# Patient Record
Sex: Male | Born: 1995 | Race: White | Hispanic: No | Marital: Single | Smoking: Current every day smoker
Health system: Southern US, Community
[De-identification: ages and names within clinical notes are randomized; demographics above are authoritative.]

---
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
## 2016-03-06 ENCOUNTER — Emergency Department
Admission: EM | Admit: 2016-03-06 | Discharge: 2016-03-06 | Disposition: A | Discharge status: Home / Self Care | Payer: No Typology Code available for payment source | Attending: Emergency Medicine | Admitting: Emergency Medicine

## 2016-03-06 ENCOUNTER — Encounter: Payer: Self-pay | Admitting: Emergency Medicine

## 2016-03-06 DIAGNOSIS — Y92524 Gas station as the place of occurrence of the external cause: Secondary | ICD-10-CM | POA: Insufficient documentation

## 2016-03-06 DIAGNOSIS — Y999 Unspecified external cause status: Secondary | ICD-10-CM | POA: Insufficient documentation

## 2016-03-06 DIAGNOSIS — F17200 Nicotine dependence, unspecified, uncomplicated: Secondary | ICD-10-CM | POA: Diagnosis not present

## 2016-03-06 DIAGNOSIS — W3400XA Accidental discharge from unspecified firearms or gun, initial encounter: Secondary | ICD-10-CM

## 2016-03-06 DIAGNOSIS — S00401A Unspecified superficial injury of right ear, initial encounter: Secondary | ICD-10-CM | POA: Diagnosis not present

## 2016-03-06 DIAGNOSIS — Y939 Activity, unspecified: Secondary | ICD-10-CM | POA: Insufficient documentation

## 2016-03-06 DIAGNOSIS — S0991XA Unspecified injury of ear, initial encounter: Secondary | ICD-10-CM

## 2016-03-06 MED ORDER — HYDROCODONE-ACETAMINOPHEN 5-325 MG PO TABS: 1.0000 | ORAL_TABLET | Freq: Four times a day (QID) | ORAL | 0 refills | Status: DC | PRN

## 2016-03-06 MED ORDER — CEPHALEXIN 500 MG PO CAPS: 500.0000 mg | ORAL_CAPSULE | Freq: Three times a day (TID) | ORAL | 0 refills | Status: DC

## 2016-03-06 MED ORDER — BACITRACIN ZINC 500 UNIT/GM EX OINT
TOPICAL_OINTMENT | Freq: Once | CUTANEOUS | Status: AC
  Administered 2016-03-06: 1 via TOPICAL
  Filled 2016-03-06: qty 0.9

## 2016-03-06 MED ORDER — CEFAZOLIN SODIUM 1 G IJ SOLR
1.0000 g | Freq: Once | INTRAMUSCULAR | Status: AC
  Administered 2016-03-06: 1 g via INTRAMUSCULAR
  Filled 2016-03-06: qty 10

## 2016-03-06 MED ORDER — IBUPROFEN 800 MG PO TABS: 800.0000 mg | ORAL_TABLET | Freq: Three times a day (TID) | ORAL | 0 refills | Status: DC | PRN
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
## 2016-03-06 NOTE — Discharge Instructions (Signed)
1. Keep wound clean and dry. You may apply thinly of Neosporin daily to right ear x 3 days. 2. Apply cool compress to affected area several times daily. 3. You may take pain medicines as needed (Motrin/Norco #15). 4. Return to the ER for worsening symptoms, significant swelling, purulent discharge or other concerns.

## 2016-03-06 NOTE — ED Notes (Signed)
[employer]udloggerol[employer] officer and detective in with patient.

## 2016-03-06 NOTE — ED Notes (Signed)
Detective in with patient and family.
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
## 2016-03-06 NOTE — ED Notes (Signed)
Cleaned pt. Ear front and back.
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
## 2016-03-06 NOTE — ED Notes (Signed)
Pt. Going home with family. 

## 2018-04-08 ENCOUNTER — Other Ambulatory Visit: Payer: Self-pay

## 2018-04-08 ENCOUNTER — Emergency Department
Admission: EM | Admit: 2018-04-08 | Discharge: 2018-04-08 | Disposition: A | Discharge status: Home / Self Care | Payer: 59 | Attending: Emergency Medicine | Admitting: Emergency Medicine

## 2018-04-08 DIAGNOSIS — J029 Acute pharyngitis, unspecified: Secondary | ICD-10-CM | POA: Insufficient documentation

## 2018-04-08 DIAGNOSIS — F17200 Nicotine dependence, unspecified, uncomplicated: Secondary | ICD-10-CM | POA: Diagnosis not present

## 2018-04-08 LAB — GROUP A STREP BY PCR: Group A Strep by PCR: NOT DETECTED

## 2018-04-08 LAB — MONONUCLEOSIS SCREEN: MONO SCREEN: NEGATIVE

## 2018-04-08 MED ORDER — PENICILLIN G BENZATHINE & PROC 1200000 UNIT/2ML IM SUSP: 1.2000 10*6.[IU] | Freq: Once | INTRAMUSCULAR | Status: DC

## 2018-04-08 MED ORDER — AMOXICILLIN-POT CLAVULANATE 875-125 MG PO TABS: 1.0000 | ORAL_TABLET | Freq: Two times a day (BID) | ORAL | 0 refills | Status: AC

## 2018-04-08 MED ORDER — AMOXICILLIN-POT CLAVULANATE 875-125 MG PO TABS
1.0000 | ORAL_TABLET | Freq: Once | ORAL | Status: AC
  Administered 2018-04-08: 1 via ORAL
  Filled 2018-04-08: qty 1

## 2018-04-08 NOTE — ED Triage Notes (Signed)
Patient reports sore throat and holt and cold flashes.

## 2018-04-08 NOTE — ED Provider Notes (Signed)
[facility] Emergency Department Provider Note _______________________   First MD Initiated Contact with Patient 04/08/18 0411     (approximate)  I have reviewed the triage vital signs and the nursing notes.   HISTORY  Chief Complaint Sore Throat   HPI John Roberson is a 22 y.o. male presents to the emergency department with a 2-day history of sore throat and fever.  Patient denies any known sick contact.  Patient denies any cough.   Past medical history None There are no active problems to display for this patient.   Past surgical history None  Prior to Admission medications   Medication Sig Start Date End Date Taking? Authorizing Provider  amoxicillin-clavulanate (AUGMENTIN) 875-125 MG tablet Take 1 tablet by mouth 2 (two) times daily for 10 days. 04/08/18 04/18/18  Darci Current, MD  cephALEXin (KEFLEX) 500 MG capsule Take 1 capsule (500 mg total) by mouth 3 (three) times daily. 03/06/16   Irean Hong, MD  HYDROcodone-acetaminophen (NORCO) 5-325 MG tablet Take 1 tablet by mouth every 6 (six) hours as needed for moderate pain. 03/06/16   Irean Hong, MD  ibuprofen (ADVIL,MOTRIN) 800 MG tablet Take 1 tablet (800 mg total) by mouth every 8 (eight) hours as needed for moderate pain. 03/06/16   Irean Hong, MD    Allergies No known drug allergies No family history on file.  Social History Social History   Tobacco Use  . Smoking status: Current Every Day Smoker  . Smokeless tobacco: Never Used  Substance Use Topics  . Alcohol use: Yes  . Drug use: No    Review of Systems Constitutional: Positive for fever/chills Eyes: No visual changes. ENT: Positive for sore throat. Cardiovascular: Denies chest pain. Respiratory: Denies shortness of breath. Gastrointestinal: No abdominal pain.  No nausea, no vomiting.  No diarrhea.  No constipation. Genitourinary: Negative for dysuria. Musculoskeletal: Negative for neck pain.  Negative for back  pain. Integumentary: Negative for rash. Neurological: Negative for headaches, focal weakness or numbness.   ____________________________________________   PHYSICAL EXAM:  VITAL SIGNS: ED Triage Vitals  Enc Vitals Group     BP 04/08/18 0132 (!) 112/54     Pulse Rate 04/08/18 0132 95     Resp 04/08/18 0132 16     Temp 04/08/18 0132 99.3 F (37.4 C)     Temp Source 04/08/18 0132 Oral     SpO2 04/08/18 0132 98 %     Weight 04/08/18 0131 72.6 kg (160 lb)     Height 04/08/18 0131 1.854 m (6\' 1" )     Head Circumference --      Peak Flow --      Pain Score 04/08/18 0131 6     Pain Loc --      Pain Edu? --      Excl. in GC? --     Constitutional: Alert and oriented. Well appearing and in no acute distress. Eyes: Conjunctivae are normal.  Head: Atraumatic. Nose: No congestion/rhinnorhea. Mouth/Throat: Mucous membranes are moist.  Pharyngeal erythema with exudate on the right tonsil.  No uvula deviation no evidence of peritonsillar abscess Neck: No stridor.  Palpable anterior cervical lymphadenopathy Cardiovascular: Normal rate, regular rhythm. Good peripheral circulation. Grossly normal heart sounds. Respiratory: Normal respiratory effort.  No retractions. Lungs CTAB. Neurologic:  Normal speech and language. No gross focal neurologic deficits are appreciated.  Skin:  Skin is warm, dry and intact. No rash noted.   ____________________________________________   LABS (all labs  ordered are listed, but only abnormal results are displayed)  Labs Reviewed  GROUP A STREP BY PCR  MONONUCLEOSIS SCREEN        Procedures   ____________________________________________   INITIAL IMPRESSION / ASSESSMENT AND PLAN / ED COURSE  As part of my medical decision making, I reviewed the following data within the electronic MEDICAL RECORD NUMBER   22 year old male presenting with above-stated history and physical exam consistent with pharyngitis.  Consider the possibility of strep  pharyngitis however rapid strep negative.  Also considered possibility of mononucleosis which was negative. ____________________________________________  FINAL CLINICAL IMPRESSION(S) / ED DIAGNOSES  Final diagnoses:  Acute pharyngitis, unspecified etiology     MEDICATIONS GIVEN DURING THIS VISIT:  Medications  amoxicillin-clavulanate (AUGMENTIN) 875-125 MG per tablet 1 tablet (1 tablet Oral Given 04/08/18 0531)     ED Discharge Orders         Ordered    amoxicillin-clavulanate (AUGMENTIN) 875-125 MG tablet  2 times daily     04/08/18 0516           Note:  This document was prepared using Dragon voice recognition software and may include unintentional dictation errors.    Darci Current, MD 04/08/18 [phone]

## 2021-04-08 ENCOUNTER — Ambulatory Visit: Payer: Self-pay

## 2021-05-17 ENCOUNTER — Ambulatory Visit: Payer: Self-pay | Admitting: Physician Assistant

## 2021-05-17 ENCOUNTER — Other Ambulatory Visit: Payer: Self-pay

## 2021-05-17 ENCOUNTER — Encounter: Payer: Self-pay | Admitting: Physician Assistant

## 2021-05-17 DIAGNOSIS — Z202 Contact with and (suspected) exposure to infections with a predominantly sexual mode of transmission: Secondary | ICD-10-CM

## 2021-05-17 DIAGNOSIS — Z113 Encounter for screening for infections with a predominantly sexual mode of transmission: Secondary | ICD-10-CM

## 2021-05-17 MED ORDER — DOXYCYCLINE HYCLATE 100 MG PO TABS: 100.0000 mg | ORAL_TABLET | Freq: Two times a day (BID) | ORAL | 0 refills | Status: AC

## 2021-05-17 MED ORDER — CEFTRIAXONE SODIUM 500 MG IJ SOLR
500.0000 mg | Freq: Once | INTRAMUSCULAR | Status: AC
  Administered 2021-05-17: 500 mg via INTRAMUSCULAR

## 2021-05-17 NOTE — Progress Notes (Signed)
Pt here as a contact to Gonorrhea and Chlamydia.  Ceftriaxone 500 mg given IM without any complications.  Medication dispensed per Provider orders. Berdie Ogren, RN

## 2021-05-17 NOTE — Progress Notes (Signed)
   [facility] Department STI clinic/screening visit  Subjective:  John Roberson is a 25 y.o. male being seen today for an STI screening visit. The patient reports they do have symptoms.    Patient has the following medical conditions:  There are no problems to display for this patient.    Chief Complaint  Patient presents with   SEXUALLY TRANSMITTED DISEASE    screening    HPI  Patient reports that he has had penile discharge and dysuria for about 1 week and he is a contact to GC and Chlamydia.  Denies chronic conditions, surgeries and regular medicines.  Reports last HIV test was 2 weeks ago  and last void prior to sample collection for Gram stain was 1 hr ago.    Screening for MPX risk: Does the patient have an unexplained rash? No Is the patient MSM? No Does the patient endorse multiple sex partners or anonymous sex partners? No Did the patient have close or sexual contact with a person diagnosed with MPX? No Has the patient traveled outside the [place] where MPX is endemic? No Is there a high clinical suspicion for MPX-- evidenced by one of the following No  -Unlikely to be chickenpox  -Lymphadenopathy  -Rash that present in same phase of evolution on any given body part   See flowsheet for further details and programmatic requirements.    The following portions of the patient's history were reviewed and updated as appropriate: allergies, current medications, past medical history, past social history, past surgical history and problem list.  Objective:  There were no vitals filed for this visit.  Physical Exam Constitutional:      General: He is not in acute distress.    Appearance: Normal appearance.  HENT:     Head: Normocephalic and atraumatic.  Eyes:     Conjunctiva/sclera: Conjunctivae normal.  Pulmonary:     Effort: Pulmonary effort is normal.  Skin:    General: Skin is warm and dry.  Neurological:     Mental Status: He is alert and oriented to  person, place, and time.  Psychiatric:        Mood and Affect: Mood normal.        Behavior: Behavior normal.        Thought Content: Thought content normal.        Judgment: Judgment normal.      Assessment and Plan:  John Roberson is a 25 y.o. male presenting to the [facility] Department for STI screening  1. Screening for STD (sexually transmitted disease) Patient into clinic with symptoms. Patient declines blood work and screening exam.  Requests treatment only today.  Rec condoms with all sex.  2. Venereal disease contact Treat as a contact to GC and Chlamydia with Ceftriaxone 500 mg IM and  Doxycycline 100 mg #14 1 po BID for 7 days. No sex for 14 days and until after partner completes treatment. Call with questions or concerns.  - cefTRIAXone (ROCEPHIN) injection 500 mg - doxycycline (VIBRA-TABS) 100 MG tablet; Take 1 tablet (100 mg total) by mouth 2 (two) times daily for 7 days.  Dispense: 14 tablet; Refill: 0     No follow-ups on file.  No future appointments.  Matt Holmes, PA

## 2022-10-11 ENCOUNTER — Inpatient Hospital Stay (HOSPITAL_COMMUNITY): Payer: Medicaid Other

## 2022-10-11 ENCOUNTER — Emergency Department (HOSPITAL_COMMUNITY): Payer: Medicaid Other

## 2022-10-11 ENCOUNTER — Inpatient Hospital Stay (HOSPITAL_COMMUNITY): Payer: Medicaid Other | Admitting: Anesthesiology

## 2022-10-11 ENCOUNTER — Inpatient Hospital Stay (HOSPITAL_COMMUNITY)
Admission: EM | Admit: 2022-10-11 | Discharge: 2022-10-12 | DRG: 482 | Disposition: A | Discharge status: Home / Self Care | Payer: Medicaid Other | Attending: Surgery | Admitting: Surgery

## 2022-10-11 ENCOUNTER — Encounter (HOSPITAL_COMMUNITY): Payer: Self-pay

## 2022-10-11 ENCOUNTER — Encounter (HOSPITAL_COMMUNITY): Admission: EM | Disposition: A | Discharge status: Home / Self Care | Payer: Self-pay | Source: Home / Self Care

## 2022-10-11 DIAGNOSIS — S72302A Unspecified fracture of shaft of left femur, initial encounter for closed fracture: Secondary | ICD-10-CM | POA: Diagnosis present

## 2022-10-11 DIAGNOSIS — E559 Vitamin D deficiency, unspecified: Secondary | ICD-10-CM | POA: Diagnosis present

## 2022-10-11 DIAGNOSIS — S7290XA Unspecified fracture of unspecified femur, initial encounter for closed fracture: Secondary | ICD-10-CM | POA: Diagnosis present

## 2022-10-11 DIAGNOSIS — Y92410 Unspecified street and highway as the place of occurrence of the external cause: Secondary | ICD-10-CM | POA: Diagnosis not present

## 2022-10-11 DIAGNOSIS — F10120 Alcohol abuse with intoxication, uncomplicated: Secondary | ICD-10-CM | POA: Diagnosis present

## 2022-10-11 DIAGNOSIS — S728X2A Other fracture of left femur, initial encounter for closed fracture: Secondary | ICD-10-CM

## 2022-10-11 DIAGNOSIS — Y908 Blood alcohol level of 240 mg/100 ml or more: Secondary | ICD-10-CM | POA: Diagnosis present

## 2022-10-11 DIAGNOSIS — F17200 Nicotine dependence, unspecified, uncomplicated: Secondary | ICD-10-CM | POA: Diagnosis present

## 2022-10-11 DIAGNOSIS — F10920 Alcohol use, unspecified with intoxication, uncomplicated: Secondary | ICD-10-CM

## 2022-10-11 DIAGNOSIS — S72322A Displaced transverse fracture of shaft of left femur, initial encounter for closed fracture: Principal | ICD-10-CM | POA: Diagnosis present

## 2022-10-11 HISTORY — PX: FEMUR IM NAIL: SHX1597

## 2022-10-11 LAB — I-STAT CHEM 8, ED
BUN: 5 mg/dL — ABNORMAL LOW (ref 6–20)
Calcium, Ion: 0.96 mmol/L — ABNORMAL LOW (ref 1.15–1.40)
Chloride: 106 mmol/L (ref 98–111)
Creatinine, Ser: 1.1 mg/dL (ref 0.61–1.24)
Glucose, Bld: 89 mg/dL (ref 70–99)
HCT: 39 % (ref 39.0–52.0)
Hemoglobin: 13.3 g/dL (ref 13.0–17.0)
Potassium: 3.4 mmol/L — ABNORMAL LOW (ref 3.5–5.1)
Sodium: 141 mmol/L (ref 135–145)
TCO2: 21 mmol/L — ABNORMAL LOW (ref 22–32)

## 2022-10-11 LAB — URINALYSIS, ROUTINE W REFLEX MICROSCOPIC
Bilirubin Urine: NEGATIVE
Glucose, UA: NEGATIVE mg/dL
Hgb urine dipstick: NEGATIVE
Ketones, ur: NEGATIVE mg/dL
Leukocytes,Ua: NEGATIVE
Nitrite: NEGATIVE
Protein, ur: NEGATIVE mg/dL
Specific Gravity, Urine: 1.01 (ref 1.005–1.030)
pH: 6 (ref 5.0–8.0)

## 2022-10-11 LAB — CBC
HCT: 39.7 % (ref 39.0–52.0)
HCT: 36.6 % — ABNORMAL LOW (ref 39.0–52.0)
Hemoglobin: 13.3 g/dL (ref 13.0–17.0)
Hemoglobin: 12.4 g/dL — ABNORMAL LOW (ref 13.0–17.0)
MCH: 30.9 pg (ref 26.0–34.0)
MCH: 30.5 pg (ref 26.0–34.0)
MCHC: 33.5 g/dL (ref 30.0–36.0)
MCHC: 33.9 g/dL (ref 30.0–36.0)
MCV: 92.1 fL (ref 80.0–100.0)
MCV: 90.1 fL (ref 80.0–100.0)
Platelets: 312 10*3/uL (ref 150–400)
Platelets: 300 10*3/uL (ref 150–400)
RBC: 4.31 MIL/uL (ref 4.22–5.81)
RBC: 4.06 MIL/uL — ABNORMAL LOW (ref 4.22–5.81)
RDW: 13.6 % (ref 11.5–15.5)
RDW: 13.5 % (ref 11.5–15.5)
WBC: 10.2 10*3/uL (ref 4.0–10.5)
WBC: 17.5 10*3/uL — ABNORMAL HIGH (ref 4.0–10.5)
nRBC: 0 % (ref 0.0–0.2)
nRBC: 0 % (ref 0.0–0.2)

## 2022-10-11 LAB — COMPREHENSIVE METABOLIC PANEL
ALT: 14 U/L (ref 0–44)
AST: 18 U/L (ref 15–41)
Albumin: 3.6 g/dL (ref 3.5–5.0)
Alkaline Phosphatase: 51 U/L (ref 38–126)
Anion gap: 13 (ref 5–15)
BUN: 5 mg/dL — ABNORMAL LOW (ref 6–20)
CO2: 19 mmol/L — ABNORMAL LOW (ref 22–32)
Calcium: 7.5 mg/dL — ABNORMAL LOW (ref 8.9–10.3)
Chloride: 107 mmol/L (ref 98–111)
Creatinine, Ser: 0.88 mg/dL (ref 0.61–1.24)
GFR, Estimated: >60 mL/min (ref >60)
Glucose, Bld: 92 mg/dL (ref 70–99)
Potassium: 3.4 mmol/L — ABNORMAL LOW (ref 3.5–5.1)
Sodium: 139 mmol/L (ref 135–145)
Total Bilirubin: 0.4 mg/dL (ref 0.3–1.2)
Total Protein: 6 g/dL — ABNORMAL LOW (ref 6.5–8.1)

## 2022-10-11 LAB — PROTIME-INR
INR: 1.1 (ref 0.8–1.2)
Prothrombin Time: 13.9 seconds (ref 11.4–15.2)

## 2022-10-11 LAB — LACTIC ACID, PLASMA: Lactic Acid, Venous: 2.4 mmol/L (ref 0.5–1.9)

## 2022-10-11 LAB — SAMPLE TO BLOOD BANK

## 2022-10-11 LAB — CREATININE, SERUM
Creatinine, Ser: 0.81 mg/dL (ref 0.61–1.24)
GFR, Estimated: >60 mL/min (ref >60)

## 2022-10-11 LAB — SURGICAL PCR SCREEN
MRSA, PCR: NEGATIVE
Staphylococcus aureus: NEGATIVE

## 2022-10-11 LAB — ETHANOL: Alcohol, Ethyl (B): 266 mg/dL — ABNORMAL HIGH (ref <10)

## 2022-10-11 LAB — HIV ANTIBODY (ROUTINE TESTING W REFLEX): HIV Screen 4th Generation wRfx: NONREACTIVE

## 2022-10-11 SURGERY — INSERTION, INTRAMEDULLARY ROD, FEMUR
Anesthesia: General | Site: Leg Upper | Laterality: Left

## 2022-10-11 MED ORDER — LORAZEPAM 1 MG PO TABS: 1.0000 mg | ORAL_TABLET | ORAL | Status: DC | PRN

## 2022-10-11 MED ORDER — CHLORHEXIDINE GLUCONATE 4 % EX LIQD: 60.0000 mL | Freq: Once | CUTANEOUS | Status: DC

## 2022-10-11 MED ORDER — ONDANSETRON HCL 4 MG PO TABS: 4.0000 mg | ORAL_TABLET | Freq: Four times a day (QID) | ORAL | Status: DC | PRN

## 2022-10-11 MED ORDER — DEXTROSE-NACL 5-0.45 % IV SOLN: INTRAVENOUS | Status: DC

## 2022-10-11 MED ORDER — FENTANYL CITRATE (PF) 100 MCG/2ML IJ SOLN
INTRAMUSCULAR | Status: AC
  Filled 2022-10-11: qty 2

## 2022-10-11 MED ORDER — SUGAMMADEX SODIUM 200 MG/2ML IV SOLN
INTRAVENOUS | Status: DC | PRN
  Administered 2022-10-11: 200 mg via INTRAVENOUS

## 2022-10-11 MED ORDER — ONDANSETRON HCL 4 MG/2ML IJ SOLN: 4.0000 mg | Freq: Four times a day (QID) | INTRAMUSCULAR | Status: DC | PRN

## 2022-10-11 MED ORDER — MIDAZOLAM HCL 5 MG/5ML IJ SOLN
INTRAMUSCULAR | Status: DC | PRN
  Administered 2022-10-11: 2 mg via INTRAVENOUS

## 2022-10-11 MED ORDER — LIDOCAINE 2% (20 MG/ML) 5 ML SYRINGE
INTRAMUSCULAR | Status: DC | PRN
  Administered 2022-10-11: 100 mg via INTRAVENOUS

## 2022-10-11 MED ORDER — DEXAMETHASONE SODIUM PHOSPHATE 10 MG/ML IJ SOLN
INTRAMUSCULAR | Status: AC
  Filled 2022-10-11: qty 1

## 2022-10-11 MED ORDER — ONDANSETRON HCL 4 MG/2ML IJ SOLN
INTRAMUSCULAR | Status: DC | PRN
  Administered 2022-10-11: 4 mg via INTRAVENOUS

## 2022-10-11 MED ORDER — PHENOL 1.4 % MT LIQD: 1.0000 | OROMUCOSAL | Status: DC | PRN

## 2022-10-11 MED ORDER — MORPHINE SULFATE (PF) 2 MG/ML IV SOLN
2.0000 mg | INTRAVENOUS | Status: DC | PRN
  Administered 2022-10-11: 4 mg via INTRAVENOUS
  Administered 2022-10-11 (×2): 2 mg via INTRAVENOUS
  Filled 2022-10-11: qty 2
  Filled 2022-10-11: qty 1
  Filled 2022-10-11: qty 2
  Filled 2022-10-11: qty 1

## 2022-10-11 MED ORDER — CEFAZOLIN SODIUM-DEXTROSE 2-4 GM/100ML-% IV SOLN
2.0000 g | INTRAVENOUS | Status: AC
  Administered 2022-10-11: 2 g via INTRAVENOUS
  Filled 2022-10-11: qty 100

## 2022-10-11 MED ORDER — CEFAZOLIN SODIUM-DEXTROSE 2-4 GM/100ML-% IV SOLN
2.0000 g | Freq: Four times a day (QID) | INTRAVENOUS | Status: AC
  Administered 2022-10-11 – 2022-10-12 (×2): 2 g via INTRAVENOUS
  Filled 2022-10-11 (×2): qty 100

## 2022-10-11 MED ORDER — MIDAZOLAM HCL 2 MG/2ML IJ SOLN
INTRAMUSCULAR | Status: AC
  Filled 2022-10-11: qty 2

## 2022-10-11 MED ORDER — HYDROMORPHONE HCL 1 MG/ML IJ SOLN
0.5000 mg | INTRAMUSCULAR | Status: DC | PRN
  Administered 2022-10-12: 1 mg via INTRAVENOUS
  Filled 2022-10-11 (×2): qty 1

## 2022-10-11 MED ORDER — ACETAMINOPHEN 325 MG PO TABS: 325.0000 mg | ORAL_TABLET | Freq: Four times a day (QID) | ORAL | Status: DC | PRN

## 2022-10-11 MED ORDER — ACETAMINOPHEN 325 MG PO TABS
650.0000 mg | ORAL_TABLET | Freq: Four times a day (QID) | ORAL | Status: DC
  Administered 2022-10-11 – 2022-10-12 (×3): 650 mg via ORAL
  Filled 2022-10-11 (×3): qty 2

## 2022-10-11 MED ORDER — ONDANSETRON HCL 4 MG/2ML IJ SOLN
4.0000 mg | Freq: Once | INTRAMUSCULAR | Status: AC
  Administered 2022-10-11: 4 mg via INTRAVENOUS
  Filled 2022-10-11: qty 2

## 2022-10-11 MED ORDER — CEFAZOLIN SODIUM-DEXTROSE 2-4 GM/100ML-% IV SOLN
2.0000 g | Freq: Once | INTRAVENOUS | Status: AC
  Administered 2022-10-11: 2 g via INTRAVENOUS
  Filled 2022-10-11: qty 100

## 2022-10-11 MED ORDER — ADULT MULTIVITAMIN W/MINERALS CH
1.0000 | ORAL_TABLET | Freq: Every day | ORAL | Status: DC
  Administered 2022-10-12: 1 via ORAL
  Filled 2022-10-11: qty 1

## 2022-10-11 MED ORDER — 0.9 % SODIUM CHLORIDE (POUR BTL) OPTIME
TOPICAL | Status: DC | PRN
  Administered 2022-10-11: 1000 mL

## 2022-10-11 MED ORDER — ORAL CARE MOUTH RINSE: 15.0000 mL | Freq: Once | OROMUCOSAL | Status: AC

## 2022-10-11 MED ORDER — POVIDONE-IODINE 10 % EX SWAB
2.0000 | Freq: Once | CUTANEOUS | Status: AC
  Administered 2022-10-11: 2 via TOPICAL

## 2022-10-11 MED ORDER — LACTATED RINGERS IV SOLN: INTRAVENOUS | Status: DC

## 2022-10-11 MED ORDER — LORAZEPAM 2 MG/ML IJ SOLN: 1.0000 mg | INTRAMUSCULAR | Status: DC | PRN

## 2022-10-11 MED ORDER — OXYCODONE HCL 5 MG PO TABS
10.0000 mg | ORAL_TABLET | ORAL | Status: DC | PRN
  Administered 2022-10-11: 10 mg via ORAL
  Administered 2022-10-12 (×3): 15 mg via ORAL
  Filled 2022-10-11 (×2): qty 2
  Filled 2022-10-11 (×2): qty 3

## 2022-10-11 MED ORDER — DOCUSATE SODIUM 100 MG PO CAPS: 100.0000 mg | ORAL_CAPSULE | Freq: Two times a day (BID) | ORAL | Status: DC

## 2022-10-11 MED ORDER — ACETAMINOPHEN 500 MG PO TABS
1000.0000 mg | ORAL_TABLET | Freq: Once | ORAL | Status: AC
  Administered 2022-10-11: 1000 mg via ORAL
  Filled 2022-10-11: qty 2

## 2022-10-11 MED ORDER — FENTANYL CITRATE PF 50 MCG/ML IJ SOSY
50.0000 ug | PREFILLED_SYRINGE | Freq: Once | INTRAMUSCULAR | Status: AC
  Administered 2022-10-11: 50 ug via INTRAVENOUS
  Filled 2022-10-11: qty 1

## 2022-10-11 MED ORDER — FENTANYL CITRATE (PF) 100 MCG/2ML IJ SOLN
25.0000 ug | INTRAMUSCULAR | Status: DC | PRN
  Administered 2022-10-11: 50 ug via INTRAVENOUS

## 2022-10-11 MED ORDER — LEVETIRACETAM IN NACL 500 MG/100ML IV SOLN
500.0000 mg | Freq: Two times a day (BID) | INTRAVENOUS | Status: DC
  Administered 2022-10-11 – 2022-10-12 (×3): 500 mg via INTRAVENOUS
  Filled 2022-10-11 (×3): qty 100

## 2022-10-11 MED ORDER — PROMETHAZINE HCL 25 MG/ML IJ SOLN: 6.2500 mg | INTRAMUSCULAR | Status: DC | PRN

## 2022-10-11 MED ORDER — METHOCARBAMOL 500 MG PO TABS
1000.0000 mg | ORAL_TABLET | Freq: Three times a day (TID) | ORAL | Status: DC
  Administered 2022-10-11 – 2022-10-12 (×3): 1000 mg via ORAL
  Filled 2022-10-11 (×3): qty 2

## 2022-10-11 MED ORDER — OXYCODONE HCL 5 MG PO TABS: 5.0000 mg | ORAL_TABLET | ORAL | Status: DC | PRN

## 2022-10-11 MED ORDER — AMISULPRIDE (ANTIEMETIC) 5 MG/2ML IV SOLN: 10.0000 mg | Freq: Once | INTRAVENOUS | Status: DC | PRN

## 2022-10-11 MED ORDER — SODIUM CHLORIDE 0.9 % IV SOLN: INTRAVENOUS | Status: DC

## 2022-10-11 MED ORDER — FOLIC ACID 1 MG PO TABS
1.0000 mg | ORAL_TABLET | Freq: Every day | ORAL | Status: DC
  Administered 2022-10-12: 1 mg via ORAL
  Filled 2022-10-11: qty 1

## 2022-10-11 MED ORDER — MENTHOL 3 MG MT LOZG: 1.0000 | LOZENGE | OROMUCOSAL | Status: DC | PRN

## 2022-10-11 MED ORDER — THIAMINE MONONITRATE 100 MG PO TABS
100.0000 mg | ORAL_TABLET | Freq: Every day | ORAL | Status: DC
  Administered 2022-10-12: 100 mg via ORAL
  Filled 2022-10-11: qty 1

## 2022-10-11 MED ORDER — CHLORHEXIDINE GLUCONATE 0.12 % MT SOLN: 15.0000 mL | Freq: Once | OROMUCOSAL | Status: AC

## 2022-10-11 MED ORDER — FENTANYL CITRATE (PF) 250 MCG/5ML IJ SOLN
INTRAMUSCULAR | Status: AC
  Filled 2022-10-11: qty 5

## 2022-10-11 MED ORDER — BISACODYL 5 MG PO TBEC: 5.0000 mg | DELAYED_RELEASE_TABLET | Freq: Every day | ORAL | Status: DC | PRN

## 2022-10-11 MED ORDER — DEXMEDETOMIDINE HCL IN NACL 80 MCG/20ML IV SOLN
INTRAVENOUS | Status: AC
  Filled 2022-10-11: qty 20

## 2022-10-11 MED ORDER — ONDANSETRON 4 MG PO TBDP: 4.0000 mg | ORAL_TABLET | Freq: Four times a day (QID) | ORAL | Status: DC | PRN

## 2022-10-11 MED ORDER — FENTANYL CITRATE (PF) 100 MCG/2ML IJ SOLN
50.0000 ug | Freq: Once | INTRAMUSCULAR | Status: AC
  Administered 2022-10-11: 50 ug via INTRAVENOUS

## 2022-10-11 MED ORDER — METHOCARBAMOL 1000 MG/10ML IJ SOLN
1000.0000 mg | Freq: Three times a day (TID) | INTRAVENOUS | Status: DC
  Filled 2022-10-11: qty 10

## 2022-10-11 MED ORDER — ROCURONIUM BROMIDE 100 MG/10ML IV SOLN
INTRAVENOUS | Status: DC | PRN
  Administered 2022-10-11: 30 mg via INTRAVENOUS
  Administered 2022-10-11: 70 mg via INTRAVENOUS

## 2022-10-11 MED ORDER — PROPOFOL 10 MG/ML IV BOLUS
INTRAVENOUS | Status: DC | PRN
  Administered 2022-10-11: 150 mg via INTRAVENOUS

## 2022-10-11 MED ORDER — IOHEXOL 350 MG/ML SOLN
100.0000 mL | Freq: Once | INTRAVENOUS | Status: AC | PRN
  Administered 2022-10-11: 100 mL via INTRAVENOUS

## 2022-10-11 MED ORDER — ALBUMIN HUMAN 5 % IV SOLN: INTRAVENOUS | Status: DC | PRN

## 2022-10-11 MED ORDER — DEXMEDETOMIDINE HCL IN NACL 80 MCG/20ML IV SOLN
INTRAVENOUS | Status: DC | PRN
  Administered 2022-10-11: 8 ug via BUCCAL

## 2022-10-11 MED ORDER — CHLORHEXIDINE GLUCONATE 0.12 % MT SOLN
OROMUCOSAL | Status: AC
  Administered 2022-10-11: 15 mL via OROMUCOSAL
  Filled 2022-10-11: qty 15

## 2022-10-11 MED ORDER — ACETAMINOPHEN 325 MG PO TABS: 650.0000 mg | ORAL_TABLET | ORAL | Status: DC | PRN

## 2022-10-11 MED ORDER — DOCUSATE SODIUM 100 MG PO CAPS
100.0000 mg | ORAL_CAPSULE | Freq: Two times a day (BID) | ORAL | Status: DC
  Administered 2022-10-11 – 2022-10-12 (×2): 100 mg via ORAL
  Filled 2022-10-11 (×2): qty 1

## 2022-10-11 MED ORDER — METOCLOPRAMIDE HCL 5 MG/ML IJ SOLN: 5.0000 mg | Freq: Three times a day (TID) | INTRAMUSCULAR | Status: DC | PRN

## 2022-10-11 MED ORDER — DEXAMETHASONE SODIUM PHOSPHATE 10 MG/ML IJ SOLN
INTRAMUSCULAR | Status: DC | PRN
  Administered 2022-10-11: 10 mg via INTRAVENOUS

## 2022-10-11 MED ORDER — ENOXAPARIN SODIUM 30 MG/0.3ML IJ SOSY
30.0000 mg | PREFILLED_SYRINGE | Freq: Two times a day (BID) | INTRAMUSCULAR | Status: DC
  Administered 2022-10-12: 30 mg via SUBCUTANEOUS
  Filled 2022-10-11: qty 0.3

## 2022-10-11 MED ORDER — SENNOSIDES-DOCUSATE SODIUM 8.6-50 MG PO TABS: 1.0000 | ORAL_TABLET | Freq: Every evening | ORAL | Status: DC | PRN

## 2022-10-11 MED ORDER — METOCLOPRAMIDE HCL 5 MG PO TABS: 5.0000 mg | ORAL_TABLET | Freq: Three times a day (TID) | ORAL | Status: DC | PRN

## 2022-10-11 MED ORDER — FENTANYL CITRATE (PF) 100 MCG/2ML IJ SOLN
INTRAMUSCULAR | Status: DC | PRN
  Administered 2022-10-11: 100 ug via INTRAVENOUS
  Administered 2022-10-11 (×3): 50 ug via INTRAVENOUS

## 2022-10-11 MED ORDER — THIAMINE HCL 100 MG/ML IJ SOLN: 100.0000 mg | Freq: Every day | INTRAMUSCULAR | Status: DC

## 2022-10-11 MED ORDER — PROPOFOL 10 MG/ML IV BOLUS
INTRAVENOUS | Status: AC
  Filled 2022-10-11: qty 20

## 2022-10-11 SURGICAL SUPPLY — 56 items
BAG COUNTER SPONGE SURGICOUNT (BAG) ×1 IMPLANT
BIT DRILL CANN FLEX 14 (BIT) IMPLANT
BIT DRILL LONG 4.2 (BIT) IMPLANT
BIT DRILL SHORT 4.2 (BIT) IMPLANT
BIT DRILL STEP 4.5X6.5 (BIT) IMPLANT
BNDG COHESIVE 6X5 TAN ST LF (GAUZE/BANDAGES/DRESSINGS) IMPLANT
BRUSH SCRUB EZ PLAIN DRY (MISCELLANEOUS) ×2 IMPLANT
COVER PERINEAL POST (MISCELLANEOUS) ×1 IMPLANT
COVER SURGICAL LIGHT HANDLE (MISCELLANEOUS) ×2 IMPLANT
DRAPE C-ARMOR (DRAPES) ×1 IMPLANT
DRAPE HALF SHEET 40X57 (DRAPES) IMPLANT
DRAPE INCISE IOBAN 66X45 STRL (DRAPES) IMPLANT
DRAPE ORTHO SPLIT 77X108 STRL (DRAPES) ×2
DRAPE SURG ORHT 6 SPLT 77X108 (DRAPES) ×2 IMPLANT
DRAPE U-SHAPE 47X51 STRL (DRAPES) ×1 IMPLANT
DRESSING MEPILEX FLEX 4X4 (GAUZE/BANDAGES/DRESSINGS) ×1 IMPLANT
DRILL BIT SHORT 4.2 (BIT) ×2
DRILL BIT STEP 4.5X6.5 (BIT) ×1
DRSG MEPILEX FLEX 4X4 (GAUZE/BANDAGES/DRESSINGS) ×3
DRSG MEPILEX POST OP 4X8 (GAUZE/BANDAGES/DRESSINGS) ×1 IMPLANT
ELECT REM PT RETURN 9FT ADLT (ELECTROSURGICAL) ×1
ELECTRODE REM PT RTRN 9FT ADLT (ELECTROSURGICAL) ×1 IMPLANT
GLOVE BIO SURGEON STRL SZ7.5 (GLOVE) ×1 IMPLANT
GLOVE BIO SURGEON STRL SZ8 (GLOVE) ×1 IMPLANT
GLOVE BIOGEL PI IND STRL 7.5 (GLOVE) ×1 IMPLANT
GLOVE BIOGEL PI IND STRL 8 (GLOVE) ×1 IMPLANT
GLOVE SURG ORTHO LTX SZ7.5 (GLOVE) ×2 IMPLANT
GOWN STRL REUS W/ TWL LRG LVL3 (GOWN DISPOSABLE) ×2 IMPLANT
GOWN STRL REUS W/ TWL XL LVL3 (GOWN DISPOSABLE) ×1 IMPLANT
GOWN STRL REUS W/TWL LRG LVL3 (GOWN DISPOSABLE) ×2
GOWN STRL REUS W/TWL XL LVL3 (GOWN DISPOSABLE) ×1
GUIDEWIRE 3.2X400 (WIRE) IMPLANT
KIT BASIN OR (CUSTOM PROCEDURE TRAY) ×1 IMPLANT
KIT TURNOVER KIT B (KITS) ×1 IMPLANT
MANIFOLD NEPTUNE II (INSTRUMENTS) ×1 IMPLANT
NAIL CANN FRN LFT 10X400 (Nail) IMPLANT
NS IRRIG 1000ML POUR BTL (IV SOLUTION) ×1 IMPLANT
PACK GENERAL/GYN (CUSTOM PROCEDURE TRAY) ×1 IMPLANT
PAD ARMBOARD 7.5X6 YLW CONV (MISCELLANEOUS) ×2 IMPLANT
REAMER ROD DEEP FLUTE 2.5X950 (INSTRUMENTS) IMPLANT
SCREW HP F/IM NL 6.5/L100/XL40 (Screw) IMPLANT
SCREW LOCK IM 5X38X125 (Screw) IMPLANT
SCREW LOCK IM 5X44 (Screw) IMPLANT
SCREW LOCK IM TI 5X40 STRL (Screw) IMPLANT
STAPLER VISISTAT 35W (STAPLE) ×1 IMPLANT
STOCKINETTE IMPERVIOUS LG (DRAPES) IMPLANT
SUT ETHILON 2 0 FS 18 (SUTURE) ×1 IMPLANT
SUT VIC AB 0 CT1 27 (SUTURE) ×1
SUT VIC AB 0 CT1 27XBRD ANBCTR (SUTURE) ×1 IMPLANT
SUT VIC AB 1 CT1 27 (SUTURE) ×1
SUT VIC AB 1 CT1 27XBRD ANBCTR (SUTURE) ×1 IMPLANT
SUT VIC AB 2-0 CT1 27 (SUTURE) ×1
SUT VIC AB 2-0 CT1 TAPERPNT 27 (SUTURE) ×1 IMPLANT
TOWEL GREEN STERILE (TOWEL DISPOSABLE) ×2 IMPLANT
TOWEL GREEN STERILE FF (TOWEL DISPOSABLE) ×1 IMPLANT
WATER STERILE IRR 1000ML POUR (IV SOLUTION) ×1 IMPLANT

## 2022-10-11 NOTE — ED Notes (Signed)
Pt log rolled. MD noted left flank abrasion.
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
## 2022-10-11 NOTE — ED Notes (Signed)
Pt transported to CT on monitor with RNs.
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
## 2022-10-11 NOTE — Anesthesia Procedure Notes (Signed)
Procedure Name: Intubation Date/Time: 10/11/2022 11:17 AM  Performed by: Mariea Clonts, CRNAPre-anesthesia Checklist: Patient identified, Emergency Drugs available, Suction available and Patient being monitored Patient Re-evaluated:Patient Re-evaluated prior to induction Oxygen Delivery Method: Circle System Utilized Preoxygenation: Pre-oxygenation with 100% oxygen Induction Type: IV induction Ventilation: Mask ventilation without difficulty Laryngoscope Size: Mac and 4 Grade View: Grade I Tube type: Oral Tube size: 7.5 mm Number of attempts: 1 Airway Equipment and Method: Stylet and Oral airway Placement Confirmation: ETT inserted through vocal cords under direct vision, positive ETCO2 and breath sounds checked- equal and bilateral Tube secured with: Tape Dental Injury: Teeth and Oropharynx as per pre-operative assessment

## 2022-10-11 NOTE — Progress Notes (Signed)
PT Cancellation Note  Patient Details Name: John Roberson MRN: [id number] DOB: Apr 01, 1996   Cancelled Treatment:    Reason Eval/Treat Not Completed: Patient not medically ready  OR today;  Will follow up postop;   Roney Marion, PT  Acute Rehabilitation Services Office [phone]   Colletta [place] 10/11/2022, 10:56 AM

## 2022-10-11 NOTE — ED Notes (Signed)
Date and time results received: 10/11/22 0155 (use smartphrase ".now" to insert current time)  Test: Lactic Acid Critical Value: 2.4   Name of Provider Notified: Randal Buba, MD  Orders Received? Or Actions Taken?:  notified

## 2022-10-11 NOTE — Progress Notes (Signed)
Orthopedic Tech Progress Note Patient Details:  John Roberson 06/29/1996 [id number]  Musculoskeletal Traction Type of Traction: Bucks Skin Traction Traction Location: lle Traction Weight: 15 lbs  I applied traction to lle after assisting to move patient to hospital bed. Ed staff assisted with traction application. Post Interventions Patient Tolerated: Well Instructions Provided: Care of device, Adjustment of device  Karolee Stamps 10/11/2022, 3:12 AM

## 2022-10-11 NOTE — Progress Notes (Signed)
Orthopedic Tech Progress Note Patient Details:  John Roberson 1996-06-18 [id number]  Patient ID: [name], male   DOB: 04/29/1996, 27 y.o.   MRN: [id number] I attended trauma page Karolee Stamps 10/11/2022, 3:13 AM

## 2022-10-11 NOTE — Transfer of Care (Signed)
Immediate Anesthesia Transfer of Care Note  Patient: John Roberson  Procedure(s) Performed: INTRAMEDULLARY (IM) NAIL FEMORAL (Left: Leg Upper)  Patient Location: PACU  Anesthesia Type:General  Level of Consciousness: drowsy and patient cooperative  Airway & Oxygen Therapy: Patient Spontanous Breathing and Patient connected to nasal cannula oxygen  Post-op Assessment: Report given to RN and Post -op Vital signs reviewed and stable  Post vital signs: Reviewed and stable  Last Vitals:  Vitals Value Taken Time  BP    Temp    Pulse 120 10/11/22 1314  Resp 11 10/11/22 1314  SpO2 98 % 10/11/22 1314  Vitals shown include unvalidated device data.  Last Pain:  Vitals:   10/11/22 1021  TempSrc: Oral  PainSc: 7       Patients Stated Pain Goal: 1 ([id number])  Complications: No notable events documented.

## 2022-10-11 NOTE — Anesthesia Postprocedure Evaluation (Signed)
Anesthesia Post Note  Patient: John Roberson  Procedure(s) Performed: INTRAMEDULLARY (IM) NAIL FEMORAL (Left: Leg Upper)     Patient location during evaluation: PACU Anesthesia Type: General Level of consciousness: sedated Pain management: pain level controlled Vital Signs Assessment: post-procedure vital signs reviewed and stable Respiratory status: spontaneous breathing and respiratory function stable Cardiovascular status: stable Postop Assessment: no apparent nausea or vomiting Anesthetic complications: no  No notable events documented.  Last Vitals:  Vitals:   10/11/22 1326 10/11/22 1341  BP: 133/84 133/86  Pulse: (!) 107 (!) 106  Resp: 13 12  Temp:  37 C  SpO2: 98% 99%    Last Pain:  Vitals:   10/11/22 1315  TempSrc:   PainSc: 7                  Pristine Gladhill DANIEL
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
## 2022-10-11 NOTE — TOC CAGE-AID Note (Signed)
Transition of Care [facility]) - CAGE-AID Screening   Patient Details  Name: John Roberson MRN: [id number] Date of Birth: 05-25-1996  Transition of Care [facility]) CM/SW Contact:    Trudee Kuster, RN Phone Number: 10/11/2022, 5:19 AM   Clinical Narrative: Patient endorses use of alcohol, denies illicit drug use at this time. Denies need for further education at this time.   CAGE-AID Screening:    Have You Ever Felt You Ought to Cut Down on Your Drinking or Drug Use?: No Have People Annoyed You By Critizing Your Drinking Or Drug Use?: No Have You Felt Bad Or Guilty About Your Drinking Or Drug Use?: No Have You Ever Had a Drink or Used Drugs First Thing In The Morning to Steady Your Nerves or to Get Rid of a Hangover?: No CAGE-AID Score: 0  Substance Abuse Education Offered: No

## 2022-10-11 NOTE — Anesthesia Preprocedure Evaluation (Addendum)
Anesthesia Evaluation  Patient identified by MRN, date of birth, ID band Patient awake    Reviewed: Allergy & Precautions, NPO status , Patient's Chart, lab work & pertinent test results  History of Anesthesia Complications Negative for: history of anesthetic complications  Airway Mallampati: II  TM Distance: >3 FB Neck ROM: Full    Dental  (+) Poor Dentition, Dental Advisory Given   Pulmonary Current Smoker and Patient abstained from smoking.   Pulmonary exam normal        Cardiovascular negative cardio ROS Normal cardiovascular exam     Neuro/Psych negative neurological ROS     GI/Hepatic negative GI ROS, Neg liver ROS,,,  Endo/Other  negative endocrine ROS    Renal/GU negative Renal ROS     Musculoskeletal   Abdominal   Peds  Hematology negative hematology ROS (+)   Anesthesia Other Findings   Reproductive/Obstetrics                             Anesthesia Physical Anesthesia Plan  ASA: 2  Anesthesia Plan: General   Post-op Pain Management: Tylenol PO (pre-op)* and Toradol IV (intra-op)*   Induction: Intravenous  PONV Risk Score and Plan: 3 and Ondansetron, Dexamethasone and Midazolam  Airway Management Planned: Oral ETT  Additional Equipment:   Intra-op Plan:   Post-operative Plan: Extubation in OR  Informed Consent: I have reviewed the patients History and Physical, chart, labs and discussed the procedure including the risks, benefits and alternatives for the proposed anesthesia with the patient or authorized representative who has indicated his/her understanding and acceptance.     Dental advisory given  Plan Discussed with: Anesthesiologist and CRNA  Anesthesia Plan Comments:        Anesthesia Quick Evaluation
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
## 2022-10-11 NOTE — ED Notes (Signed)
Pt refusing foley catheter
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
## 2022-10-12 ENCOUNTER — Other Ambulatory Visit (HOSPITAL_COMMUNITY): Payer: Self-pay

## 2022-10-12 ENCOUNTER — Encounter (HOSPITAL_COMMUNITY): Payer: Self-pay

## 2022-10-12 DIAGNOSIS — E559 Vitamin D deficiency, unspecified: Secondary | ICD-10-CM | POA: Diagnosis present

## 2022-10-12 DIAGNOSIS — F17200 Nicotine dependence, unspecified, uncomplicated: Secondary | ICD-10-CM | POA: Diagnosis present

## 2022-10-12 LAB — BASIC METABOLIC PANEL
Anion gap: 10 (ref 5–15)
BUN: 5 mg/dL — ABNORMAL LOW (ref 6–20)
CO2: 24 mmol/L (ref 22–32)
Calcium: 8.5 mg/dL — ABNORMAL LOW (ref 8.9–10.3)
Chloride: 103 mmol/L (ref 98–111)
Creatinine, Ser: 0.96 mg/dL (ref 0.61–1.24)
GFR, Estimated: >60 mL/min (ref >60)
Glucose, Bld: 141 mg/dL — ABNORMAL HIGH (ref 70–99)
Potassium: 3.8 mmol/L (ref 3.5–5.1)
Sodium: 137 mmol/L (ref 135–145)

## 2022-10-12 LAB — CBC
HCT: 36.9 % — ABNORMAL LOW (ref 39.0–52.0)
Hemoglobin: 12.4 g/dL — ABNORMAL LOW (ref 13.0–17.0)
MCH: 30.7 pg (ref 26.0–34.0)
MCHC: 33.6 g/dL (ref 30.0–36.0)
MCV: 91.3 fL (ref 80.0–100.0)
Platelets: 301 10*3/uL (ref 150–400)
RBC: 4.04 MIL/uL — ABNORMAL LOW (ref 4.22–5.81)
RDW: 13.8 % (ref 11.5–15.5)
WBC: 15.2 10*3/uL — ABNORMAL HIGH (ref 4.0–10.5)
nRBC: 0 % (ref 0.0–0.2)

## 2022-10-12 LAB — VITAMIN D 25 HYDROXY (VIT D DEFICIENCY, FRACTURES): Vit D, 25-Hydroxy: 21.76 ng/mL — ABNORMAL LOW (ref 30–100)

## 2022-10-12 MED ORDER — APIXABAN 2.5 MG PO TABS
2.5000 mg | ORAL_TABLET | Freq: Two times a day (BID) | ORAL | 0 refills | Status: AC
  Filled 2022-10-12: qty 60, 30d supply, fill #0

## 2022-10-12 MED ORDER — CHOLECALCIFEROL 125 MCG (5000 UT) PO TABS
ORAL_TABLET | Freq: Every day | ORAL | 6 refills | Status: AC
  Filled 2022-10-12: qty 30, 30d supply, fill #0

## 2022-10-12 MED ORDER — ACETAMINOPHEN 500 MG PO TABS: 1000.0000 mg | ORAL_TABLET | Freq: Four times a day (QID) | ORAL | Status: AC

## 2022-10-12 MED ORDER — OXYCODONE HCL 10 MG PO TABS
10.0000 mg | ORAL_TABLET | ORAL | 0 refills | Status: AC | PRN
  Filled 2022-10-12: qty 30, 4d supply, fill #0

## 2022-10-12 MED ORDER — METHOCARBAMOL 500 MG PO TABS
500.0000 mg | ORAL_TABLET | Freq: Three times a day (TID) | ORAL | 0 refills | Status: AC | PRN
  Filled 2022-10-12: qty 90, 15d supply, fill #0

## 2022-10-12 NOTE — Progress Notes (Signed)
Discharge instructions are given to the pt and his mother. Meds from [facility] are delivered. All questions answered. Waiting on equipment.
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
## 2022-10-12 NOTE — Discharge Instructions (Signed)
Orthopaedic Trauma Service Discharge Instructions   General Discharge Instructions  Orthopaedic Injuries:  Left femur fracture treated with intramedullary nailing  WEIGHT BEARING STATUS: Weight-bear as tolerated left leg with walker or crutches.  Wean off walker or crutches as you feel comfortable doing so  RANGE OF MOTION/ACTIVITY: Unrestricted range of motion left hip and knee.  Slowly increase activity level.  Bone health: Labs show some vitamin D insufficiency.  Please take vitamin D3 5000 IUs daily.   Review the following resource for additional information regarding bone health  [email or web address]  Wound Care: Daily wound care starting on 10/14/2022.  You can use Mepilex dressings which also noted a silicone foam dressings.  These are the dressings you have on currently.  Alternatively you can use 4 x 4 gauze and tape.  Once there is no drainage you can leave the incisions open to the air  Discharge Wound Care Instructions  Do NOT apply any ointments, solutions or lotions to pin sites or surgical wounds.  These prevent needed drainage and even though solutions like hydrogen peroxide kill bacteria, they also damage cells lining the pin sites that help fight infection.  Applying lotions or ointments can keep the wounds moist and can cause them to breakdown and open up as well. This can increase the risk for infection. When in doubt call the office.  Surgical incisions should be dressed daily.  If any drainage is noted, Mepilex (silicone foam dressing) or 4 x 4 gauze and tape  [email or web address]  [email or web address]  These dressing supplies should be available at local medical supply stores ([facility], [facility], etc). They are not usually carried at places like [facility], [facility], [facility], etc  Once the incision is completely dry and without drainage, it may be left open to air out.  Showering may begin 36-48 hours later.  Cleaning gently with soap and water.   DVT/PE prophylaxis: Eliquis 2.5 mg every 12 hours x 30 days  Diet: as you were eating previously.  Can use over the counter stool softeners and bowel preparations, such as Miralax, to help with bowel movements.  Narcotics can be constipating.  Be sure to drink plenty of fluids  PAIN MEDICATION USE AND EXPECTATIONS  You have likely been given narcotic medications to help control your pain.  After a traumatic event that results in an fracture (broken bone) with or without surgery, it is ok to use narcotic pain medications to help control one's pain.  We understand that everyone responds to pain differently and each individual patient will be evaluated on a regular basis for the continued need for narcotic medications. Ideally, narcotic medication use should last no more than 6-8 weeks (coinciding with fracture healing).   As a patient it is your responsibility as well to monitor narcotic medication use and report the amount and frequency you use these medications when you come to your office visit.   We would also advise that if you are using narcotic medications, you should take a dose prior to therapy to maximize you participation.  IF YOU ARE ON NARCOTIC MEDICATIONS IT IS NOT PERMISSIBLE TO OPERATE A MOTOR VEHICLE (MOTORCYCLE/CAR/TRUCK/MOPED) OR HEAVY MACHINERY DO NOT MIX NARCOTICS WITH OTHER CNS (CENTRAL NERVOUS SYSTEM) DEPRESSANTS SUCH AS ALCOHOL   POST-OPERATIVE OPIOID TAPER INSTRUCTIONS: It is important to wean off of your opioid medication as soon as possible. If you do not need pain medication after your surgery it is ok to stop day one. Opioids include: Codeine, Hydrocodone(Norco, Vicodin), Oxycodone(Percocet,  oxycontin) and  hydromorphone amongst others.  Long term and even short term use of opiods can cause: Increased pain response Dependence Constipation Depression Respiratory depression And more.  Withdrawal symptoms can include Flu like symptoms Nausea, vomiting And more Techniques to manage these symptoms Hydrate well Eat regular healthy meals Stay active Use relaxation techniques(deep breathing, meditating, yoga) Do Not substitute Alcohol to help with tapering If you have been on opioids for less than two weeks and do not have pain than it is ok to stop all together.  Plan to wean off of opioids This plan should start within one week post op of your fracture surgery  Maintain the same interval or time between taking each dose and first decrease the dose.  Cut the total daily intake of opioids by one tablet each day Next start to increase the time between doses. The last dose that should be eliminated is the evening dose.    STOP SMOKING OR USING NICOTINE PRODUCTS!!!!  As discussed nicotine severely impairs your body's ability to heal surgical and traumatic wounds but also impairs bone healing.  Wounds and bone heal by forming microscopic blood vessels (angiogenesis) and nicotine is a vasoconstrictor (essentially, shrinks blood vessels).  Therefore, if vasoconstriction occurs to these microscopic blood vessels they essentially disappear and are unable to deliver necessary nutrients to the healing tissue.  This is one modifiable factor that you can do to dramatically increase your chances of healing your injury.    (This means no smoking, no nicotine gum, patches, etc)  DO NOT USE NONSTEROIDAL ANTI-INFLAMMATORY DRUGS (NSAID'S)  Using products such as Advil (ibuprofen), Aleve (naproxen), Motrin (ibuprofen) for additional pain control during fracture healing can delay and/or prevent the healing response.  If you would like to take over the counter (OTC) medication, Tylenol (acetaminophen) is ok.   However, some narcotic medications that are given for pain control contain acetaminophen as well. Therefore, you should not exceed more than 4000 mg of tylenol in a day if you do not have liver disease.  Also note that there are may OTC medicines, such as cold medicines and allergy medicines that my contain tylenol as well.  If you have any questions about medications and/or interactions please ask your doctor/PA or your pharmacist.      ICE AND ELEVATE INJURED/OPERATIVE EXTREMITY  Using ice and elevating the injured extremity above your heart can help with swelling and pain control.  Icing in a pulsatile fashion, such as 20 minutes on and 20 minutes off, can be followed.    Do not place ice directly on skin. Make sure there is a barrier between to skin and the ice pack.    Using frozen items such as frozen peas works well as the conform nicely to the are that needs to be iced.  USE AN ACE WRAP OR TED HOSE FOR SWELLING CONTROL  In addition to icing and elevation, Ace wraps or TED hose are used to help limit and resolve swelling.  It is recommended to use Ace wraps or TED hose until you are informed to stop.    When using Ace Wraps start the wrapping distally (farthest away from the body) and wrap proximally (closer to the body)   Example: If you had surgery on your leg or thing and you do not have a splint on, start the ace wrap at the toes and work your way up to the thigh        If you had surgery on your  upper extremity and do not have a splint on, start the ace wrap at your fingers and work your way up to the upper arm  IF YOU ARE IN A SPLINT OR CAST DO NOT REMOVE IT FOR ANY REASON   If your splint gets wet for any reason please contact the office immediately. You may shower in your splint or cast as long as you keep it dry.  This can be done by wrapping in a cast cover or garbage back (or similar)  Do Not stick any thing down your splint or cast such as pencils, money, or hangers to try and  scratch yourself with.  If you feel itchy take benadryl as prescribed on the bottle for itching  IF YOU ARE IN A CAM BOOT (BLACK BOOT)  You may remove boot periodically. Perform daily dressing changes as noted below.  Wash the liner of the boot regularly and wear a sock when wearing the boot. It is recommended that you sleep in the boot until told otherwise    Call office for the following: Temperature greater than 101F Persistent nausea and vomiting Severe uncontrolled pain Redness, tenderness, or signs of infection (pain, swelling, redness, odor or green/yellow discharge around the site) Difficulty breathing, headache or visual disturbances Hives Persistent dizziness or light-headedness Extreme fatigue Any other questions or concerns you may have after discharge  In an emergency, call 911 or go to an Emergency Department at a nearby hospital  HELPFUL INFORMATION  If you had a block, it will wear off between 8-24 hrs postop typically.  This is period when your pain may go from nearly zero to the pain you would have had postop without the block.  This is an abrupt transition but nothing dangerous is happening.  You may take an extra dose of narcotic when this happens.  You should wean off your narcotic medicines as soon as you are able.  Most patients will be off or using minimal narcotics before their first postop appointment.   We suggest you use the pain medication the first night prior to going to bed, in order to ease any pain when the anesthesia wears off. You should avoid taking pain medications on an empty stomach as it will make you nauseous.  Do not drink alcoholic beverages or take illicit drugs when taking pain medications.  In most states it is against the law to drive while you are in a splint or sling.  And certainly against the law to drive while taking narcotics.  You may return to work/school in the next couple of days when you feel up to it.   Pain medication may  make you constipated.  Below are a few solutions to try in this order: Decrease the amount of pain medication if you aren't having pain. Drink lots of decaffeinated fluids. Drink prune juice and/or each dried prunes  If the first 3 don't work start with additional solutions Take Colace - an over-the-counter stool softener Take Senokot - an over-the-counter laxative Take Miralax - a stronger over-the-counter laxative     CALL THE OFFICE WITH ANY QUESTIONS OR CONCERNS: [phone]   VISIT OUR WEBSITE FOR ADDITIONAL INFORMATION: [email or web address]

## 2022-10-17 ENCOUNTER — Encounter (HOSPITAL_COMMUNITY): Payer: Self-pay | Admitting: Orthopedic Surgery
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
## 2022-11-28 ENCOUNTER — Ambulatory Visit: Payer: Medicaid Other | Attending: Orthopedic Surgery

## 2022-11-28 DIAGNOSIS — M6281 Muscle weakness (generalized): Secondary | ICD-10-CM | POA: Diagnosis present

## 2022-11-28 DIAGNOSIS — M79652 Pain in left thigh: Secondary | ICD-10-CM | POA: Diagnosis present

## 2022-11-30 ENCOUNTER — Ambulatory Visit: Payer: No Typology Code available for payment source

## 2022-12-09 ENCOUNTER — Ambulatory Visit: Payer: Medicaid Other | Attending: Orthopedic Surgery

## 2022-12-09 DIAGNOSIS — M79652 Pain in left thigh: Secondary | ICD-10-CM | POA: Insufficient documentation

## 2022-12-09 DIAGNOSIS — M6281 Muscle weakness (generalized): Secondary | ICD-10-CM | POA: Insufficient documentation
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
## 2022-12-14 ENCOUNTER — Ambulatory Visit: Payer: Medicaid Other

## 2022-12-14 DIAGNOSIS — M79652 Pain in left thigh: Secondary | ICD-10-CM

## 2022-12-14 DIAGNOSIS — M6281 Muscle weakness (generalized): Secondary | ICD-10-CM
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
## 2022-12-16 ENCOUNTER — Ambulatory Visit: Payer: Medicaid Other

## 2022-12-16 DIAGNOSIS — M79652 Pain in left thigh: Secondary | ICD-10-CM

## 2022-12-16 DIAGNOSIS — M6281 Muscle weakness (generalized): Secondary | ICD-10-CM

## 2022-12-19 ENCOUNTER — Ambulatory Visit: Payer: Medicaid Other

## 2022-12-21 ENCOUNTER — Ambulatory Visit: Payer: Medicaid Other

## 2022-12-23 ENCOUNTER — Ambulatory Visit: Payer: Medicaid Other

## 2022-12-23 DIAGNOSIS — M79652 Pain in left thigh: Secondary | ICD-10-CM

## 2022-12-23 DIAGNOSIS — M6281 Muscle weakness (generalized): Secondary | ICD-10-CM
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
## 2022-12-28 ENCOUNTER — Ambulatory Visit: Payer: Medicaid Other

## 2022-12-28 DIAGNOSIS — M79652 Pain in left thigh: Secondary | ICD-10-CM | POA: Diagnosis not present

## 2022-12-28 DIAGNOSIS — M6281 Muscle weakness (generalized): Secondary | ICD-10-CM

## 2022-12-30 ENCOUNTER — Ambulatory Visit: Payer: Medicaid Other

## 2022-12-30 DIAGNOSIS — M6281 Muscle weakness (generalized): Secondary | ICD-10-CM

## 2022-12-30 DIAGNOSIS — M79652 Pain in left thigh: Secondary | ICD-10-CM
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
## 2023-01-02 ENCOUNTER — Ambulatory Visit: Payer: Medicaid Other | Attending: Orthopedic Surgery

## 2023-01-02 DIAGNOSIS — M6281 Muscle weakness (generalized): Secondary | ICD-10-CM | POA: Insufficient documentation

## 2023-01-02 DIAGNOSIS — M79652 Pain in left thigh: Secondary | ICD-10-CM | POA: Insufficient documentation

## 2023-01-04 ENCOUNTER — Ambulatory Visit: Payer: Medicaid Other

## 2023-01-04 DIAGNOSIS — M6281 Muscle weakness (generalized): Secondary | ICD-10-CM | POA: Diagnosis present

## 2023-01-04 DIAGNOSIS — M79652 Pain in left thigh: Secondary | ICD-10-CM | POA: Diagnosis present
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
## 2023-01-09 ENCOUNTER — Ambulatory Visit: Payer: Medicaid Other

## 2023-01-09 DIAGNOSIS — M6281 Muscle weakness (generalized): Secondary | ICD-10-CM

## 2023-01-09 DIAGNOSIS — M79652 Pain in left thigh: Secondary | ICD-10-CM
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
## 2023-01-11 ENCOUNTER — Ambulatory Visit: Payer: Medicaid Other

## 2023-01-11 DIAGNOSIS — M6281 Muscle weakness (generalized): Secondary | ICD-10-CM | POA: Diagnosis not present

## 2023-01-11 DIAGNOSIS — M79652 Pain in left thigh: Secondary | ICD-10-CM

## 2023-01-16 ENCOUNTER — Ambulatory Visit: Payer: Medicaid Other

## 2023-01-16 DIAGNOSIS — M6281 Muscle weakness (generalized): Secondary | ICD-10-CM

## 2023-01-16 DIAGNOSIS — M79652 Pain in left thigh: Secondary | ICD-10-CM
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
## 2023-01-18 ENCOUNTER — Ambulatory Visit: Payer: Medicaid Other

## 2023-01-23 ENCOUNTER — Ambulatory Visit: Payer: Medicaid Other

## 2023-01-27 ENCOUNTER — Ambulatory Visit: Payer: Medicaid Other

## 2023-02-01 ENCOUNTER — Ambulatory Visit: Payer: Medicaid Other | Attending: Orthopedic Surgery
